# Patient Record
Sex: Male | Born: 1995 | Race: Black or African American | Hispanic: No | Marital: Single
Health system: Southern US, Community
[De-identification: ages and names within clinical notes are randomized; demographics above are authoritative.]

## PROBLEM LIST (undated history)

## (undated) DIAGNOSIS — Z789 Other specified health status: Secondary | ICD-10-CM

---
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
## 2017-09-18 IMAGING — CR DG LUMBAR SPINE COMPLETE 4+V
5 series · 5 of 5 positions shown · non-contrast
Comparison: None.

CLINICAL DATA: Status post fall.  Low back pain.

EXAM:
LUMBAR SPINE - COMPLETE 4+ VIEW

[t lumbar spine ap]
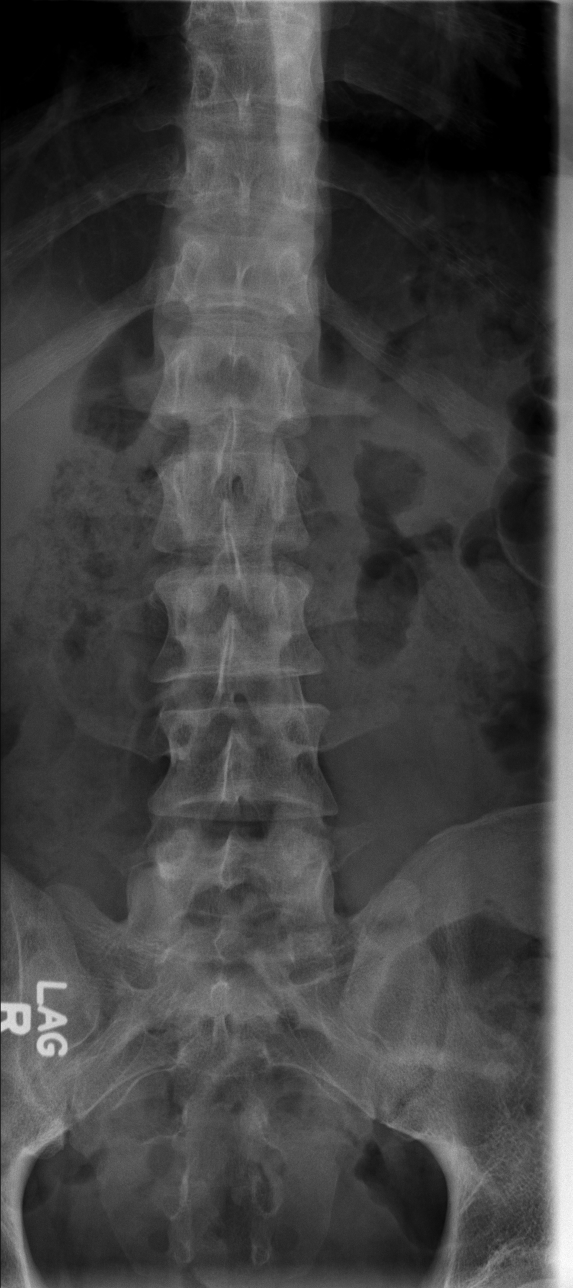

[t lumbar spine obl (1 of 2)]
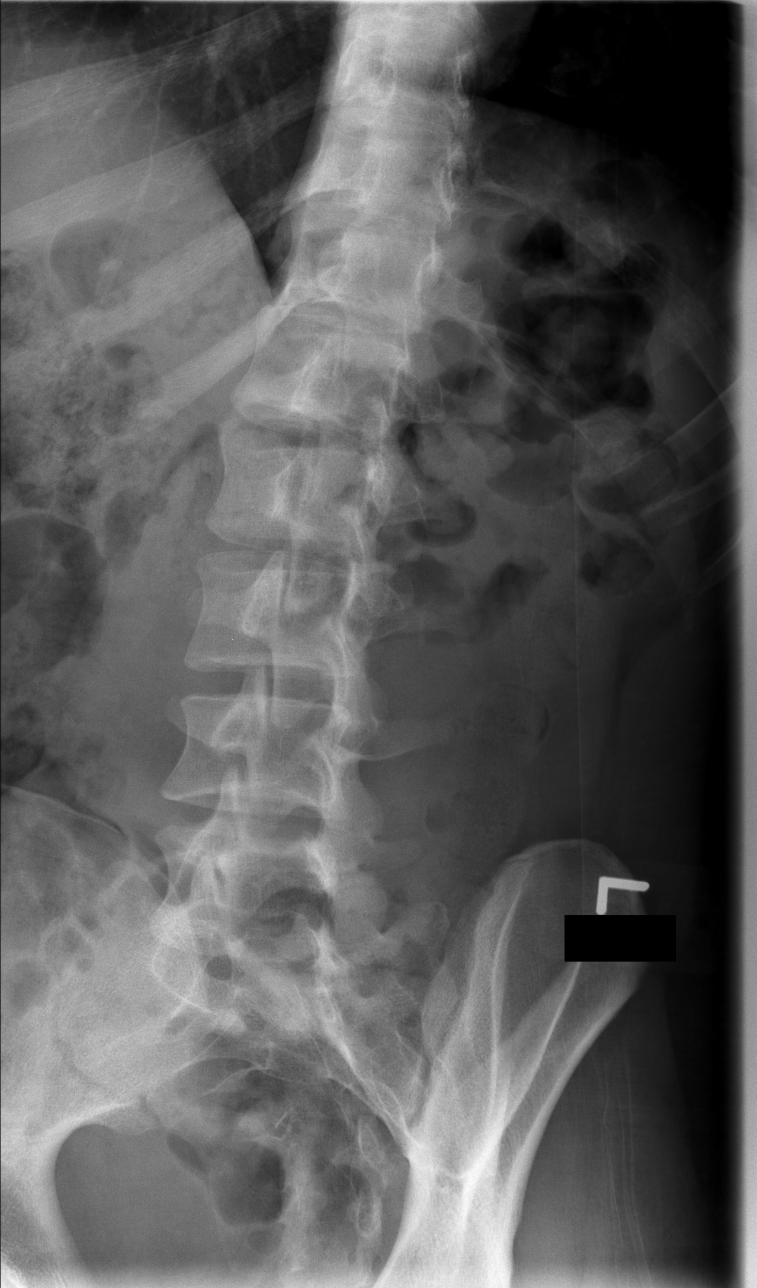

[t lumbar spine obl (2 of 2)]
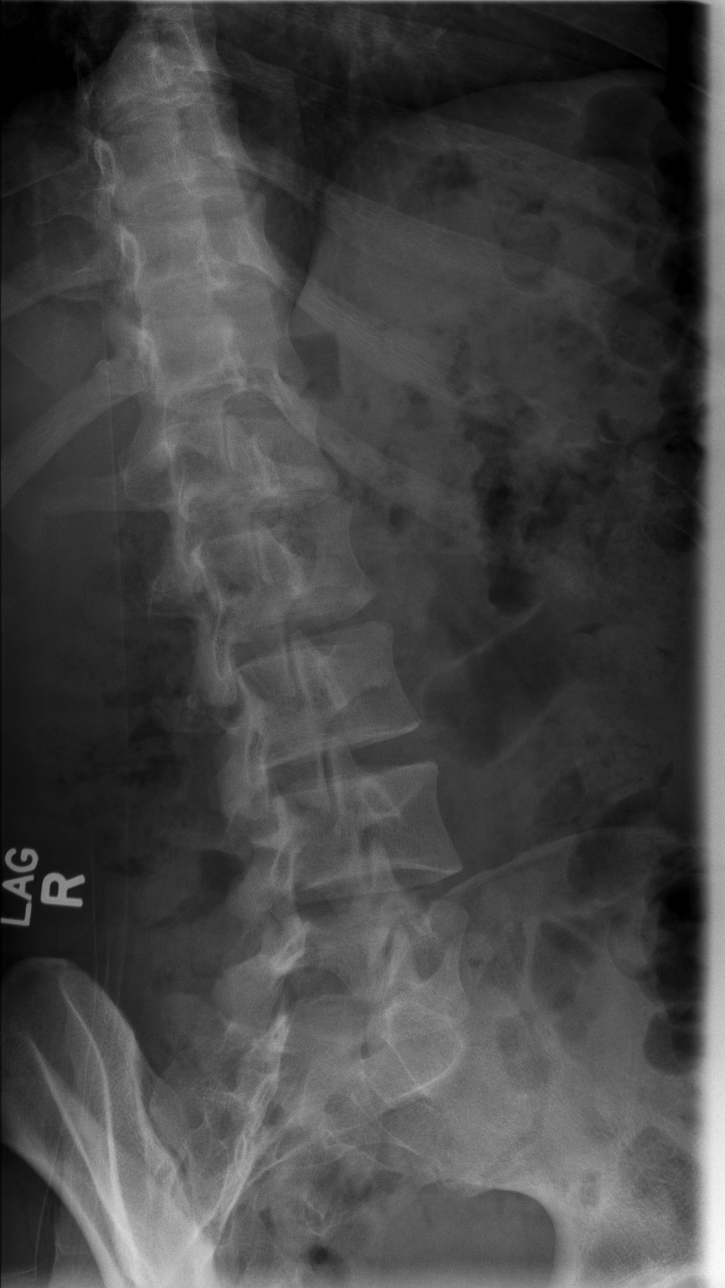

[t lumbar spine lat]
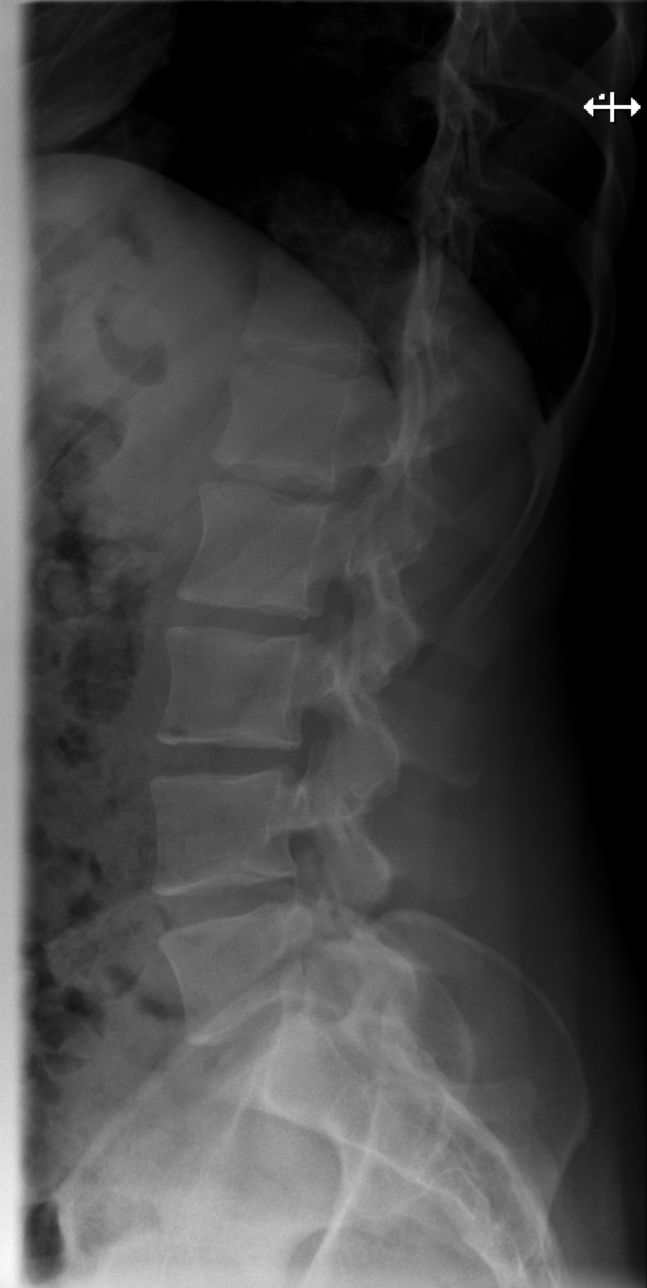

[t lumbar l-5 s-1 spot]
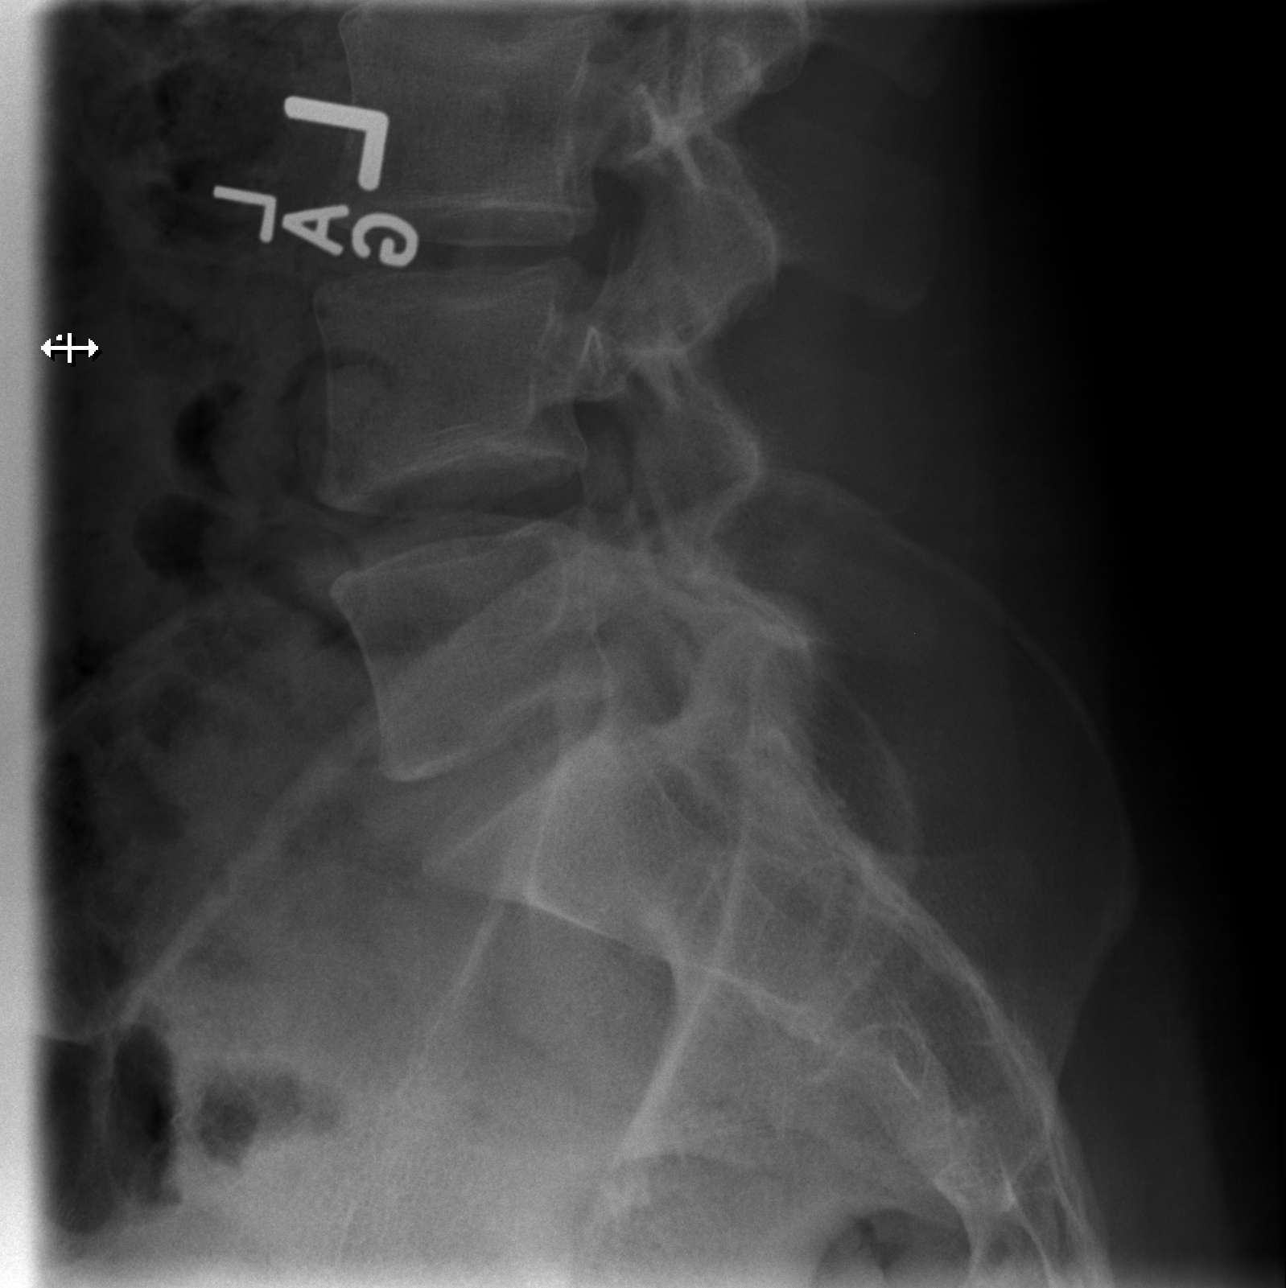

[5 of 5 positions shown; findings below may reference images not displayed]

FINDINGS: There is no evidence of lumbar spine fracture. Alignment is normal.
Intervertebral disc spaces are maintained. Oblique views show no
evidence of pars interarticularis defect.
IMPRESSION: No fracture or listhesis of the lumbar spine.

## 2022-01-11 ENCOUNTER — Emergency Department (HOSPITAL_COMMUNITY)
Admission: EM | Admit: 2022-01-11 | Discharge: 2022-01-12 | Disposition: A | Discharge status: Home / Self Care | Payer: Self-pay | Attending: Emergency Medicine | Admitting: Emergency Medicine

## 2022-01-11 ENCOUNTER — Other Ambulatory Visit: Payer: Self-pay

## 2022-01-11 ENCOUNTER — Encounter (HOSPITAL_COMMUNITY): Payer: Self-pay

## 2022-01-11 ENCOUNTER — Emergency Department (HOSPITAL_COMMUNITY): Payer: Self-pay

## 2022-01-11 DIAGNOSIS — S8991XA Unspecified injury of right lower leg, initial encounter: Secondary | ICD-10-CM | POA: Insufficient documentation

## 2022-01-11 DIAGNOSIS — W010XXA Fall on same level from slipping, tripping and stumbling without subsequent striking against object, initial encounter: Secondary | ICD-10-CM | POA: Insufficient documentation

## 2022-01-11 MED ORDER — IBUPROFEN 800 MG PO TABS: 800.0000 mg | ORAL_TABLET | Freq: Three times a day (TID) | ORAL | 0 refills | Status: AC

## 2022-01-11 MED ORDER — IBUPROFEN 200 MG PO TABS
600.0000 mg | ORAL_TABLET | Freq: Once | ORAL | Status: AC
  Administered 2022-01-11: 600 mg via ORAL
  Filled 2022-01-11: qty 3

## 2022-01-11 NOTE — ED Triage Notes (Signed)
Pt reports with right knee pain after falling while doing jiu jitsu today. Pt states that he heard a pop and is unable to fully make his leg straight.

## 2022-01-11 NOTE — Discharge Instructions (Signed)
Use the crutches to help rest your knee.  Apply ice and take the medications for pain.  Follow-up with an orthopedic doctor for further evaluation

## 2022-01-11 NOTE — ED Provider Notes (Signed)
[facility] DEPT Provider Note   CSN: [id number] Arrival date & time: 01/11/22  2215     History  Chief Complaint  Patient presents with   right knee pain    Adam Young is a 26 y.o. male.  HPI  Patient presents to the ED with complaints of acute knee pain.  Patient states he was doing jujitsu today when doing up falling and injuring his knee.  Patient heard a pop and ever since then he has been having significant pain in his knee and is not able to fully straighten his leg.  Patient denies any other injuries.  No numbness or weakness.  Home Medications Prior to Admission medications   Medication Sig Start Date End Date Taking? Authorizing Provider  ibuprofen (ADVIL) 800 MG tablet Take 1 tablet (800 mg total) by mouth 3 (three) times daily. 01/11/22  Yes Dorie Rank, MD  HYDROcodone-acetaminophen (NORCO/VICODIN) 5-325 MG per tablet Take 1-2 tablets by mouth every 4 (four) hours as needed for pain. 09/25/13   Virgel Manifold, MD  methocarbamol (ROBAXIN) 500 MG tablet Take 1 tablet (500 mg total) by mouth 2 (two) times daily as needed for muscle spasms. 08/08/16   Antonietta Breach, PA-C  naproxen (NAPROSYN) 500 MG tablet Take 1 tablet (500 mg total) by mouth 2 (two) times daily as needed for mild pain or moderate pain. 08/08/16   Antonietta Breach, PA-C      Allergies    Patient has no known allergies.    Review of Systems   Review of Systems  Constitutional:  Negative for fever.   Physical Exam Updated Vital Signs BP (!) 148/67 (BP Location: Right Arm)    Pulse 68    Temp 98.2 F (36.8 C) (Oral)    Resp 18    Ht 1.905 m (6\' 3" )    Wt 106.6 kg    SpO2 100%    BMI 29.37 kg/m  Physical Exam Vitals and nursing note reviewed.  Constitutional:      General: He is not in acute distress.    Appearance: He is well-developed.  HENT:     Head: Normocephalic and atraumatic.     Right Ear: External ear normal.     Left Ear: External ear normal.  Eyes:     General:  No scleral icterus.       Right eye: No discharge.        Left eye: No discharge.     Conjunctiva/sclera: Conjunctivae normal.  Neck:     Trachea: No tracheal deviation.  Cardiovascular:     Rate and Rhythm: Normal rate.  Pulmonary:     Effort: Pulmonary effort is normal. No respiratory distress.     Breath sounds: No stridor.  Abdominal:     General: There is no distension.  Musculoskeletal:        General: Tenderness present. No swelling or deformity.     Cervical back: Neck supple.     Right knee: Tenderness present over the medial joint line and lateral joint line. Normal alignment.     Comments: Exam limited due to patient discomfort..  does not allow me to range his knee  Skin:    General: Skin is warm and dry.     Findings: No rash.  Neurological:     Mental Status: He is alert.     Cranial Nerves: Cranial nerve deficit: no gross deficits.    ED Results / Procedures / Treatments   Labs (all labs ordered  are listed, but only abnormal results are displayed) Labs Reviewed - No data to display  EKG None  Radiology DG Knee Complete 4 Views Right  Result Date: 01/11/2022 CLINICAL DATA:  Felipa Evener a pop.  Pain.  Unable to extend knee. EXAM: RIGHT KNEE - COMPLETE 4+ VIEW COMPARISON:  None. FINDINGS: No acute fracture. Mild patellar [place]. Tibiofemoral alignment is maintained, allowing for difficulty with positioning. No significant knee joint effusion. Sclerotic density in the mid tibial shaft has benign characteristics, may represent sequela of remote injury. IMPRESSION: Mild patellar [place]. No acute fracture or dislocation. Electronically Signed   By: Keith Rake M.D.   On: 01/11/2022 22:59    Procedures Procedures    Medications Ordered in ED Medications  ibuprofen (ADVIL) tablet 600 mg (600 mg Oral Given 01/11/22 2314)    ED Course/ Medical Decision Making/ A&P Clinical Course as of 01/11/22 2333  Fri Jan 11, 2022  2306 X-ray images and radiology report reviewed.  No  dislocation.  Mild patellar Soledad Gerlach [JK]    Clinical Course User Index [JK] Dorie Rank, MD                           Medical Decision Making Risk OTC drugs. Prescription drug management.   Patient presents with knee injury.  No fracture or dislocation on x-ray.  Patient has limited range of motion because of his discomfort.  It is possible he has a meniscal injury.  May also has injured his ACL.  Will provide crutches and Ace wrap for comfort.  Prescribe NSAIDs.  Outpatient follow-up with orthopedics.        Final Clinical Impression(s) / ED Diagnoses Final diagnoses:  Injury of right knee, initial encounter    Rx / DC Orders ED Discharge Orders          Ordered    ibuprofen (ADVIL) 800 MG tablet  3 times daily        01/11/22 2332              Dorie Rank, MD 01/11/22 2333

## 2022-01-11 NOTE — ED Provider Triage Note (Signed)
Emergency Medicine Provider Triage Evaluation Note  Aceson Labell , a 26 y.o. male  was evaluated in triage.  Pt complains of right knee pain.  Patient states yesterday he was performing jujitsu when he planted his foot and then heard a pop.  Patient unable to extend his knee, stating he is in extreme pain.  Review of Systems  Positive: Right knee pain Negative: Numbness, tingling  Physical Exam  BP (!) 148/67 (BP Location: Right Arm)    Pulse 68    Temp 98.2 F (36.8 C) (Oral)    Resp 18    Ht 6\' 3"  (1.905 m)    Wt 106.6 kg    SpO2 100%    BMI 29.37 kg/m  Gen:   Awake, no distress   Resp:  Normal effort  MSK:   Moves extremities without difficulty  Other:  Patient unable to range knee. Patient holding knee in flexed position  Medical Decision Making  Medically screening exam initiated at 10:30 PM.  Appropriate orders placed.  Macio Kissoon was informed that the remainder of the evaluation will be completed by another provider, this initial triage assessment does not replace that evaluation, and the importance of remaining in the ED until their evaluation is complete.     [place], PA-C 01/11/22 2231

## 2022-01-12 NOTE — ED Notes (Signed)
Pt discharged. Instructions and prescription given. AAOX4. Pt in no apparent distress with moderate pain with movement. The opportunity to ask questions was provided.
